# Patient Record
Sex: Male | Born: 1955 | Race: White | Hispanic: No | Marital: Married | Smoking: Never smoker
Health system: Southern US, Community
[De-identification: ages and names within clinical notes are randomized; demographics above are authoritative.]

## PROBLEM LIST (undated history)

## (undated) DIAGNOSIS — N400 Enlarged prostate without lower urinary tract symptoms: Secondary | ICD-10-CM

## (undated) DIAGNOSIS — I472 Ventricular tachycardia: Secondary | ICD-10-CM

## (undated) DIAGNOSIS — M1990 Unspecified osteoarthritis, unspecified site: Secondary | ICD-10-CM

## (undated) DIAGNOSIS — I10 Essential (primary) hypertension: Secondary | ICD-10-CM

## (undated) DIAGNOSIS — N200 Calculus of kidney: Secondary | ICD-10-CM

## (undated) DIAGNOSIS — F419 Anxiety disorder, unspecified: Secondary | ICD-10-CM

## (undated) DIAGNOSIS — I82409 Acute embolism and thrombosis of unspecified deep veins of unspecified lower extremity: Secondary | ICD-10-CM

## (undated) DIAGNOSIS — I4729 Other ventricular tachycardia: Secondary | ICD-10-CM

## (undated) DIAGNOSIS — E663 Overweight: Secondary | ICD-10-CM

## (undated) DIAGNOSIS — I493 Ventricular premature depolarization: Secondary | ICD-10-CM

## (undated) HISTORY — DX: Anxiety disorder, unspecified: F41.9

## (undated) HISTORY — DX: Acute embolism and thrombosis of unspecified deep veins of unspecified lower extremity: I82.409

## (undated) HISTORY — DX: Benign prostatic hyperplasia without lower urinary tract symptoms: N40.0

## (undated) HISTORY — DX: Essential (primary) hypertension: I10

## (undated) HISTORY — DX: Unspecified osteoarthritis, unspecified site: M19.90

## (undated) HISTORY — DX: Ventricular tachycardia: I47.2

## (undated) HISTORY — PX: HERNIA REPAIR: SHX51

## (undated) HISTORY — DX: Other ventricular tachycardia: I47.29

## (undated) HISTORY — DX: Calculus of kidney: N20.0

## (undated) HISTORY — DX: Overweight: E66.3

---

## 2000-06-12 ENCOUNTER — Ambulatory Visit (HOSPITAL_BASED_OUTPATIENT_CLINIC_OR_DEPARTMENT_OTHER): Admission: RE | Admit: 2000-06-12 | Discharge: 2000-06-12 | Payer: Self-pay | Admitting: *Deleted

## 2000-07-10 ENCOUNTER — Ambulatory Visit (HOSPITAL_BASED_OUTPATIENT_CLINIC_OR_DEPARTMENT_OTHER): Admission: RE | Admit: 2000-07-10 | Discharge: 2000-07-10 | Payer: Self-pay | Admitting: *Deleted

## 2006-08-31 ENCOUNTER — Emergency Department (HOSPITAL_COMMUNITY): Admission: EM | Admit: 2006-08-31 | Discharge: 2006-08-31 | Payer: Self-pay | Admitting: Emergency Medicine

## 2010-06-23 NOTE — Op Note (Signed)
[place]. [facility]  Patient:    Bobby Abbott, Bobby Abbott                       MRN: [id number] Proc. Date: 07/10/00 Adm. Date:  [id number] Attending:  Kendell Bane                           Operative Report  PREOPERATIVE DIAGNOSIS:  Possible abscess, left thumb CMC joint.  POSTOPERATIVE DIAGNOSIS:  Negative for abscess.  PROCEDURE:  Exploration of left thumb CMC joint with incision and drainage.  SURGEON:  Lowell Bouton, M.D.  ANESTHESIA:  General anesthesia.  FINDINGS:  The patient had no evidence of purulence in the [facility] joint or along the ulnar side of the first metacarpal.  DESCRIPTION OF PROCEDURE:  Under general anesthesia with a tourniquet on the left arm, the left hand was prepped and draped in the usual fashion.  After elevating the limb, the tourniquet was inflated to 250 mmHg.  A transverse incision was made over the dorsum of the carpal metacarpal joint of the thumb on the left.  Blunt dissection was carried through the subcutaneous tissues and bleeding points were coagulated.  Blunt dissection was carried down to the [facility] joint area and there was no purulent material obtained.  The dissection was carried bluntly along the ulnar side of the first metacarpal where the patient complained of maximum tenderness.  No purulent material was identified.  Care was taken to protect the dorsal branch of the radial artery. The wound was irrigated copiously with saline.  Vessel loop drain was left in for drainage and the skin was closed with a 4-0 subcuticular Prolene.  Sterile dressings were applied followed by a thumb spica splint.  The patient tolerated the procedure well and went to the recovery room awake and stable and in good condition. DD:  07/10/00 TD:  07/10/00 Job: [id number]

## 2010-06-23 NOTE — Op Note (Signed)
[place]. [facility]  Patient:    MAE, CIANCI                       MRN: [id number] Adm. Date:  [id number] Attending:  MeyerdierksEmelda Fear CC:         Dr. Fuller Canada                           Operative Report  PREOPERATIVE DIAGNOSIS:  Subluxation, right first carpometacarpal joint.  POSTOPERATIVE DIAGNOSIS:  Subluxation, right first carpometacarpal joint, with partially-healed malunion of a Bennetts fracture.  PROCEDURE:  Open reduction and internal fixation, right first metacarpal, with pinning of carpometacarpal joint.  SURGEON:  Lowell Bouton, M.D.  ANESTHESIA:  General.  OPERATIVE FINDINGS:  The patient had an eight-week-old Bennetts fracture that had a very small fracture fragment.  The fracture was not completely healed, and there was a step-off at the [facility] joint with subluxation.  The articular surface had some minimal arthritic changes.  DESCRIPTION OF PROCEDURE:  Under general anesthesia with a tourniquet on the right arm, the right hand was prepped and draped in the usual fashion, and after exsanguinating the limb, the tourniquet was inflated to 225 mmHg.  A curved incision was made over the volar aspect of the [facility] joint and carried down through the subcutaneous tissues.  Bleeding points were coagulated.  The thenar muscles were elevated sharply off of the first metacarpal, and the CMC joint was incised.  Subperiosteal dissection was done volarly and dorsally, lifting the muscle off of the base of the first metacarpal.  The joint was then examined and was found to be subluxed radially.  A Freer elevator was used to examine the joint, and there were some minimal arthritic changes.  The step-off noted ulnarly was then identified as a small Bennetts fracture, and a Therapist, nutritional was used to take down the fracture.  The fracture was then reduced and pinned with a 4-5 K-wire x 2.  The [facility] joint alignment improved with  the pinning of the fracture; however, it was totally reduced and stabilized with a 4-5 K-wire.  X-rays showed good alignment of the joint.  The pins were bent over and left protruding through the skin.  The capsular tissue was closed with a 4-0 Vicryl, and the muscle was closed with 4-0 Vicryl.  A vessel loop drain was left in for drainage.  The wound was irrigated and closed with a 4-0 subcuticular Prolene.  Steri-Strips were applied, followed by sterile dressings and a thumb spica splint.  The patient tolerated the procedure well and went to the recovery room awake and stable, in good condition. DD:  06/12/00 TD:  06/13/00 Job: [id number]

## 2013-01-21 ENCOUNTER — Other Ambulatory Visit: Payer: Self-pay | Admitting: Orthopaedic Surgery

## 2013-01-21 DIAGNOSIS — M25512 Pain in left shoulder: Secondary | ICD-10-CM

## 2013-01-24 ENCOUNTER — Other Ambulatory Visit: Payer: Self-pay

## 2013-01-25 ENCOUNTER — Ambulatory Visit
Admission: RE | Admit: 2013-01-25 | Discharge: 2013-01-25 | Disposition: A | Discharge status: Home / Self Care | Payer: BC Managed Care – PPO | Source: Ambulatory Visit | Attending: Orthopaedic Surgery | Admitting: Orthopaedic Surgery

## 2013-01-25 DIAGNOSIS — M25512 Pain in left shoulder: Secondary | ICD-10-CM

## 2014-08-23 ENCOUNTER — Encounter (HOSPITAL_COMMUNITY)
Admission: RE | Disposition: A | Discharge status: Home / Self Care | Payer: Self-pay | Source: Ambulatory Visit | Attending: Ophthalmology

## 2014-08-23 ENCOUNTER — Ambulatory Visit (HOSPITAL_COMMUNITY)
Admission: RE | Admit: 2014-08-23 | Discharge: 2014-08-23 | Disposition: A | Discharge status: Home / Self Care | Payer: 59 | Source: Ambulatory Visit | Attending: Ophthalmology | Admitting: Ophthalmology

## 2014-08-23 DIAGNOSIS — H524 Presbyopia: Secondary | ICD-10-CM | POA: Insufficient documentation

## 2014-08-23 DIAGNOSIS — H26492 Other secondary cataract, left eye: Secondary | ICD-10-CM | POA: Insufficient documentation

## 2014-08-23 DIAGNOSIS — M179 Osteoarthritis of knee, unspecified: Secondary | ICD-10-CM | POA: Diagnosis not present

## 2014-08-23 DIAGNOSIS — Z961 Presence of intraocular lens: Secondary | ICD-10-CM | POA: Insufficient documentation

## 2014-08-23 DIAGNOSIS — H5210 Myopia, unspecified eye: Secondary | ICD-10-CM | POA: Insufficient documentation

## 2014-08-23 DIAGNOSIS — H52209 Unspecified astigmatism, unspecified eye: Secondary | ICD-10-CM | POA: Insufficient documentation

## 2014-08-23 DIAGNOSIS — H2640 Unspecified secondary cataract: Secondary | ICD-10-CM | POA: Diagnosis present

## 2014-08-23 HISTORY — PX: YAG LASER APPLICATION: SHX6189

## 2014-08-23 SURGERY — TREATMENT, USING YAG LASER
Anesthesia: LOCAL | Laterality: Left

## 2014-08-23 MED ORDER — TETRACAINE HCL 0.5 % OP SOLN
OPHTHALMIC | Status: AC
  Filled 2014-08-23: qty 2

## 2014-08-23 MED ORDER — TROPICAMIDE 1 % OP SOLN
OPHTHALMIC | Status: AC
  Filled 2014-08-23: qty 3

## 2014-08-23 MED ORDER — TROPICAMIDE 1 % OP SOLN
1.0000 [drp] | OPHTHALMIC | Status: AC
  Administered 2014-08-23 (×3): 1 [drp] via OPHTHALMIC

## 2014-08-23 MED ORDER — TETRACAINE HCL 0.5 % OP SOLN
1.0000 [drp] | OPHTHALMIC | Status: AC
  Administered 2014-08-23 (×3): 1 [drp] via OPHTHALMIC

## 2014-08-23 NOTE — Brief Op Note (Signed)
Bobby FavaJerry Iman Sr. 08/23/2014  [name], MD  Pre-op Diagnosis:  secondary cataract left eye  Post-op Diagnosis:  same  Yag laser self-test completed: Yes.    Indications:  See scanned office H&P for specific indications  Procedure: YAG posterior capsulotomy OS  Eye protection worn by staff:  Yes.   Laser In Use sign on door:  Yes.    Laser:  {LUMENIS YAG/SLT LASER  selecta duet  Power Setting:  1.7 mJ/burst Anatomical site treated:  Posterior capsule OS Number of applications:  18 Total energy delivered: 28.29 mJ Results:  Open visual axis  Patient was instructed to go to the office, as previously scheduled, for intraocular pressure:  No.  Patient verbalizes understanding of discharge instructions:  Yes.    Notes:  Pt tolerated procedure well.

## 2014-08-23 NOTE — Discharge Instructions (Signed)
Bobby Abbott.  08/23/2014     Instructions    Activity: No Restrictions.   Diet: Resume Diet you were on at home.   Pain Medication: Tylenol if Needed.   CONTACT YOUR DOCTOR IF YOU HAVE PAIN, REDNESS IN YOUR EYE, OR DECREASED VISION.   Follow-up:in 2 weeks with Susa Simmondsarroll F Haines, MD.   Dr. Nile RiggsShapiro: [phone]  Dr. Lita MainsHaines: [phone]: [phone]  Dr. Alto DenverHunt: [phone]: [phone]   If you find that you cannot contact your physician, but feel that your signs and   Symptoms warrant a physician's attention, call the Emergency Room at   [phone] ext.[phone].   Othern/a. FOLLOW UP APPOINTMENT ON 09/14/2014 @ 11:00 AM

## 2014-08-23 NOTE — H&P (Signed)
I have reviewed the pre printed H&P, the patient was re-examined, and I have identified no significant interval changes in the patient's medical condition.  There is no change in the plan of care since the history and physical of record. 

## 2014-08-24 ENCOUNTER — Encounter (HOSPITAL_COMMUNITY): Payer: Self-pay | Admitting: Ophthalmology

## 2015-04-13 ENCOUNTER — Encounter: Payer: Self-pay | Admitting: Emergency Medicine

## 2015-04-13 ENCOUNTER — Emergency Department
Admission: EM | Admit: 2015-04-13 | Discharge: 2015-04-13 | Disposition: A | Discharge status: Home / Self Care | Payer: BLUE CROSS/BLUE SHIELD | Attending: Student | Admitting: Student

## 2015-04-13 ENCOUNTER — Emergency Department: Payer: BLUE CROSS/BLUE SHIELD

## 2015-04-13 DIAGNOSIS — S40011A Contusion of right shoulder, initial encounter: Secondary | ICD-10-CM | POA: Insufficient documentation

## 2015-04-13 DIAGNOSIS — Y9389 Activity, other specified: Secondary | ICD-10-CM | POA: Diagnosis not present

## 2015-04-13 DIAGNOSIS — Y92410 Unspecified street and highway as the place of occurrence of the external cause: Secondary | ICD-10-CM | POA: Insufficient documentation

## 2015-04-13 DIAGNOSIS — S161XXA Strain of muscle, fascia and tendon at neck level, initial encounter: Secondary | ICD-10-CM | POA: Insufficient documentation

## 2015-04-13 DIAGNOSIS — S7001XA Contusion of right hip, initial encounter: Secondary | ICD-10-CM | POA: Insufficient documentation

## 2015-04-13 DIAGNOSIS — Z880 Allergy status to penicillin: Secondary | ICD-10-CM | POA: Insufficient documentation

## 2015-04-13 DIAGNOSIS — I10 Essential (primary) hypertension: Secondary | ICD-10-CM | POA: Diagnosis not present

## 2015-04-13 DIAGNOSIS — Y998 Other external cause status: Secondary | ICD-10-CM | POA: Diagnosis not present

## 2015-04-13 DIAGNOSIS — S199XXA Unspecified injury of neck, initial encounter: Secondary | ICD-10-CM | POA: Diagnosis present

## 2015-04-13 MED ORDER — TRAMADOL HCL 50 MG PO TABS: 50.0000 mg | ORAL_TABLET | Freq: Four times a day (QID) | ORAL | Status: DC | PRN

## 2015-04-13 MED ORDER — IBUPROFEN 800 MG PO TABS
800.0000 mg | ORAL_TABLET | Freq: Once | ORAL | Status: AC
  Administered 2015-04-13: 800 mg via ORAL
  Filled 2015-04-13: qty 1

## 2015-04-13 MED ORDER — METHOCARBAMOL 500 MG PO TABS
1000.0000 mg | ORAL_TABLET | Freq: Once | ORAL | Status: AC
  Administered 2015-04-13: 1000 mg via ORAL
  Filled 2015-04-13: qty 2

## 2015-04-13 MED ORDER — METHOCARBAMOL 750 MG PO TABS: 1500.0000 mg | ORAL_TABLET | Freq: Four times a day (QID) | ORAL | Status: DC

## 2015-04-13 MED ORDER — TRAMADOL HCL 50 MG PO TABS
50.0000 mg | ORAL_TABLET | Freq: Once | ORAL | Status: AC
  Administered 2015-04-13: 50 mg via ORAL
  Filled 2015-04-13: qty 1

## 2015-04-13 MED ORDER — IBUPROFEN 800 MG PO TABS: 800.0000 mg | ORAL_TABLET | Freq: Three times a day (TID) | ORAL | Status: DC | PRN
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
## 2015-04-13 NOTE — ED Notes (Signed)
Correction: pain in right hip and shoulder, bruising noted to right hip.

## 2015-04-16 ENCOUNTER — Emergency Department (HOSPITAL_COMMUNITY)
Admission: EM | Admit: 2015-04-16 | Discharge: 2015-04-16 | Disposition: A | Discharge status: Home / Self Care | Payer: BLUE CROSS/BLUE SHIELD | Attending: Emergency Medicine | Admitting: Emergency Medicine

## 2015-04-16 ENCOUNTER — Encounter (HOSPITAL_COMMUNITY): Payer: Self-pay | Admitting: Emergency Medicine

## 2015-04-16 ENCOUNTER — Emergency Department (HOSPITAL_COMMUNITY): Payer: BLUE CROSS/BLUE SHIELD

## 2015-04-16 DIAGNOSIS — Y929 Unspecified place or not applicable: Secondary | ICD-10-CM | POA: Diagnosis not present

## 2015-04-16 DIAGNOSIS — Y999 Unspecified external cause status: Secondary | ICD-10-CM | POA: Diagnosis not present

## 2015-04-16 DIAGNOSIS — I10 Essential (primary) hypertension: Secondary | ICD-10-CM | POA: Insufficient documentation

## 2015-04-16 DIAGNOSIS — Z791 Long term (current) use of non-steroidal anti-inflammatories (NSAID): Secondary | ICD-10-CM | POA: Insufficient documentation

## 2015-04-16 DIAGNOSIS — S39012A Strain of muscle, fascia and tendon of lower back, initial encounter: Secondary | ICD-10-CM | POA: Insufficient documentation

## 2015-04-16 DIAGNOSIS — Y939 Activity, unspecified: Secondary | ICD-10-CM | POA: Diagnosis not present

## 2015-04-16 DIAGNOSIS — S344XXA Injury of lumbosacral plexus, initial encounter: Secondary | ICD-10-CM | POA: Diagnosis present

## 2015-04-16 HISTORY — DX: Ventricular premature depolarization: I49.3

## 2015-04-16 NOTE — Discharge Instructions (Signed)
The [facility] will help with the muscle spasm. You may apply ice or heat to the area as well. Follow up with your doctor. Return here as needed.
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
## 2015-07-25 ENCOUNTER — Encounter: Payer: Self-pay | Admitting: *Deleted

## 2015-07-26 ENCOUNTER — Encounter: Payer: Self-pay | Admitting: Cardiology

## 2015-07-26 ENCOUNTER — Encounter: Payer: Self-pay | Admitting: *Deleted

## 2015-07-26 ENCOUNTER — Ambulatory Visit (INDEPENDENT_AMBULATORY_CARE_PROVIDER_SITE_OTHER): Payer: BLUE CROSS/BLUE SHIELD | Admitting: Cardiology

## 2015-07-26 VITALS — BP 148/85 | HR 79 | Ht 71.0 in | Wt 279.6 lb

## 2015-07-26 DIAGNOSIS — I10 Essential (primary) hypertension: Secondary | ICD-10-CM

## 2015-07-26 DIAGNOSIS — I471 Supraventricular tachycardia: Secondary | ICD-10-CM

## 2015-07-26 DIAGNOSIS — R002 Palpitations: Secondary | ICD-10-CM | POA: Diagnosis not present

## 2015-07-26 DIAGNOSIS — F419 Anxiety disorder, unspecified: Secondary | ICD-10-CM

## 2015-07-26 NOTE — Patient Instructions (Signed)
Medication Instructions:  Your physician recommends that you continue on your current medications as directed. Please refer to the Current Medication list given to you today.  Labwork: NONE  Testing/Procedures: Your physician has recommended that you wear an event monitor for 30 days. Event monitors are medical devices that record the heart's electrical activity. Doctors most often us these monitors to diagnose arrhythmias. Arrhythmias are problems with the speed or rhythm of the heartbeat. The monitor is a small, portable device. You can wear one while you do your normal daily activities. This is usually used to diagnose what is causing palpitations/syncope (passing out). [employer] will contact you  About this monitor.  Follow-Up: Your physician recommends that you schedule a follow-up appointment in: pending test results. We will call you with the results of your monitor.  Any Other Special Instructions Will Be Listed Below (If Applicable).  If you need a refill on your cardiac medications before your next appointment, please call your pharmacy.
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
## 2015-08-01 ENCOUNTER — Ambulatory Visit (INDEPENDENT_AMBULATORY_CARE_PROVIDER_SITE_OTHER): Payer: BLUE CROSS/BLUE SHIELD

## 2015-08-01 DIAGNOSIS — R002 Palpitations: Secondary | ICD-10-CM | POA: Diagnosis not present

## 2015-08-19 ENCOUNTER — Emergency Department (HOSPITAL_COMMUNITY): Payer: BLUE CROSS/BLUE SHIELD

## 2015-08-19 ENCOUNTER — Encounter (HOSPITAL_COMMUNITY): Payer: Self-pay | Admitting: *Deleted

## 2015-08-19 ENCOUNTER — Telehealth: Payer: Self-pay | Admitting: Cardiology

## 2015-08-19 DIAGNOSIS — E669 Obesity, unspecified: Secondary | ICD-10-CM | POA: Insufficient documentation

## 2015-08-19 DIAGNOSIS — I472 Ventricular tachycardia: Secondary | ICD-10-CM | POA: Diagnosis not present

## 2015-08-19 DIAGNOSIS — R000 Tachycardia, unspecified: Secondary | ICD-10-CM | POA: Diagnosis present

## 2015-08-19 DIAGNOSIS — I10 Essential (primary) hypertension: Secondary | ICD-10-CM | POA: Insufficient documentation

## 2015-08-19 DIAGNOSIS — Z6841 Body Mass Index (BMI) 40.0 and over, adult: Secondary | ICD-10-CM | POA: Diagnosis not present

## 2015-08-19 LAB — CBC
HEMATOCRIT: 43.4 % (ref 39.0–52.0)
HEMOGLOBIN: 15 g/dL (ref 13.0–17.0)
MCH: 33.1 pg (ref 26.0–34.0)
MCHC: 34.6 g/dL (ref 30.0–36.0)
MCV: 95.8 fL (ref 78.0–100.0)
Platelets: 200 10*3/uL (ref 150–400)
RBC: 4.53 MIL/uL (ref 4.22–5.81)
RDW: 12.6 % (ref 11.5–15.5)
WBC: 9.6 10*3/uL (ref 4.0–10.5)

## 2015-08-19 LAB — BASIC METABOLIC PANEL
ANION GAP: 6 (ref 5–15)
BUN: 14 mg/dL (ref 6–20)
CHLORIDE: 108 mmol/L (ref 101–111)
CO2: 25 mmol/L (ref 22–32)
Calcium: 9.4 mg/dL (ref 8.9–10.3)
Creatinine, Ser: 1.3 mg/dL — ABNORMAL HIGH (ref 0.61–1.24)
GFR calc Af Amer: >60 mL/min (ref >60)
GFR, EST NON AFRICAN AMERICAN: 59 mL/min — AB (ref >60)
GLUCOSE: 83 mg/dL (ref 65–99)
POTASSIUM: 4.1 mmol/L (ref 3.5–5.1)
Sodium: 139 mmol/L (ref 135–145)

## 2015-08-19 LAB — TROPONIN I: Troponin I: <0.03 ng/mL (ref <0.03)

## 2015-08-19 NOTE — Telephone Encounter (Signed)
Pt is wearing event monitor and tonight he had 11 sec of V tach.  HR 210.  Pt was aware of rapid HR.  He did not feel lightheaded but in the past with these arrhthymias he has almost passed out.  [facility].  He knows not to drive.  He has had these episodes for 30 years.  Dr. Diona BrownerMcDowell has reviewed.  Plan will be to admit and plan EP consult in AM.

## 2015-08-19 NOTE — ED Notes (Signed)
The pt is c/o a rapid heart rate intermittently since he was 42.  He is currently on a heart monitor.  He was called and told to come in at 1530 today because his heart was beating at 200.  Not now

## 2015-08-20 ENCOUNTER — Emergency Department (HOSPITAL_COMMUNITY)
Admission: EM | Admit: 2015-08-20 | Discharge: 2015-08-20 | Discharge status: Against Medical Advice | Payer: BLUE CROSS/BLUE SHIELD | Attending: Emergency Medicine | Admitting: Emergency Medicine

## 2015-08-20 DIAGNOSIS — I472 Ventricular tachycardia, unspecified: Secondary | ICD-10-CM

## 2015-08-20 LAB — MAGNESIUM: MAGNESIUM: 2.2 mg/dL (ref 1.7–2.4)
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
## 2015-08-20 NOTE — Progress Notes (Signed)
Mr. Bobby Abbott is patient of Diona BrownerMcDowell who has been getting workup for palpitations as out patient. He suffers from palpitations for the last 18-20 years. He was given an event monitor outpt and today the event monitor showed wide complex tachycardia self terminated. He states that he feels these symptoms for the last 20 years and they self terminate. He was advised to come to ED tonight and [facility]. Currently, he denies any symptoms. He would prefer to get all the workup done as outpt and does not want to stay in hospital. I have discussed all the risks  In detail. He understands and wants to see Dr. Diona BrownerMcDowell in office and get outpt wokrup  He will be discharged with outpt follow up . Sulaiman Alonna MiniumAziz Rathore

## 2015-08-20 NOTE — Discharge Instructions (Signed)
An arrhythmia is a change in the heart's normal rate or rhythm. Typically, the heart beats with a regular rhythm at a rate between 60 and 100 beats per minute. Problems with the heart's electrical system or the heart's response to the electrical signal can interrupt the heart's coordination and cause arrhythmias. Ventricular arrhythmias are abnormalities that affect these ventricles, or lower chambers of the heart.  Ventricular arrhythmias include:  An electrocardiogram of an episode of sustained ventricular tachycardia. An electrocardiogram of an episode of sustained ventricular tachycardia. Premature ventricular complexes (PVCs), which are premature heartbeats; Ventricular tachycardia, an abnormally fast heartbeat; and Ventricular fibrillation, in which the heart quivers rather than contracts. Ventricular fibrillation is a medical emergency that requires immediate attention.  WHAT ARE THE SYMPTOMS?  An electrocardiogram reflecting the irregular, pulseless electrical activity of ventricular fibrillation. An electrocardiogram reflecting the irregular, pulseless electrical activity of ventricular fibrillation. Some ventricular arrhythmias have no symptoms. When symptoms do occur, they include:  Diminished or irregular pulse; Fatigue; Shortness of breath; Fainting (syncope); Palpitations (awareness of one's own heartbeat); Low blood pressure; Chest pain; and Cardiac arrest. CAUSES AND RISK FACTORS  The causes of ventricular arrhythmias include: Coronary heart disease (CHD); Congestive heart failure; Left ventricular dysfunction; Hypertension (high blood pressure); Cardiomyopathy (dilated or hypertrophic); Congenital (inherited) heart disease; and Valve disease (aortic or mitral). DIAGNOSIS  The physician will take a detailed medical history and perform a physical examination, during which he or she may hear irregular heartbeats with a stethoscope. The physician may also order  tests, including an electrocardiogram (ECG), either resting or ambulatory (using a portable machine called a Holter monitor or a loop recorder). The physician may also order electrophysiology testing.  TREATMENT APPROACH  Many cases of arrhythmias may not require treatment. Other arrhythmias can be treated by treating any underlying heart disease. Treatments for ventricular arrhythmia includes: Defibrillation; Medication (beta-blockers and antiarrhythmic agents); Radiofrequency catheter ablation; Angioplasty; and Pacemaker implantation.

## 2015-08-23 ENCOUNTER — Telehealth: Payer: Self-pay | Admitting: *Deleted

## 2015-08-23 DIAGNOSIS — I472 Ventricular tachycardia, unspecified: Secondary | ICD-10-CM

## 2015-08-23 NOTE — Telephone Encounter (Signed)
-----   Message from Jonelle SidleSamuel G McDowell, MD sent at 08/23/2015  7:47 AM EDT ----- This patient is currently wearing an event monitor that showed VT. [facility], seen by the cardiology fellow, and sent home for outpatient cardiac workup. Please make sure that he has a visit scheduled. I will be referring him to EP as well so you may want to go ahead and get him on their schedule for new consult.  ----- Message -----    From: Leone BrandLaura R Ingold, NP    Sent: 08/22/2015  10:43 AM      To: Jonelle SidleSamuel G McDowell, MD  Hi pt did come to ER and talked with fellow but wanted to be worked up as outpt.   Your office may need to call.  Thanks.

## 2015-08-24 NOTE — Telephone Encounter (Signed)
Patient informed and verbalized understanding of plan. 

## 2015-08-26 ENCOUNTER — Telehealth: Payer: Self-pay | Admitting: Cardiology

## 2015-09-06 ENCOUNTER — Encounter: Payer: Self-pay | Admitting: Internal Medicine

## 2015-09-07 ENCOUNTER — Ambulatory Visit (INDEPENDENT_AMBULATORY_CARE_PROVIDER_SITE_OTHER): Payer: BLUE CROSS/BLUE SHIELD | Admitting: Internal Medicine

## 2015-09-07 ENCOUNTER — Encounter (INDEPENDENT_AMBULATORY_CARE_PROVIDER_SITE_OTHER): Payer: Self-pay

## 2015-09-07 ENCOUNTER — Encounter: Payer: Self-pay | Admitting: Internal Medicine

## 2015-09-07 VITALS — BP 149/85 | HR 72 | Ht 72.0 in | Wt 275.8 lb

## 2015-09-07 DIAGNOSIS — R002 Palpitations: Secondary | ICD-10-CM | POA: Diagnosis not present

## 2015-09-07 DIAGNOSIS — I472 Ventricular tachycardia, unspecified: Secondary | ICD-10-CM

## 2015-09-07 NOTE — Patient Instructions (Signed)
Medication Instructions:   Your physician recommends that you continue on your current medications as directed. Please refer to the Current Medication list given to you today.   Labwork: None ordered   Testing/Procedures: Your physician has requested that you have a cardiac MRI. Cardiac MRI uses a computer to create images of your heart as its beating, producing both still and moving pictures of your heart and major blood vessels. For further information please visit [email or web address]. Please follow the instruction sheet given to you today for more information.    Follow-Up: Your physician recommends that you schedule a follow-up appointment in: 4 weeks in [facility] by Fri with your records to give to Dr Allred   Any Other Special Instructions Will Be Listed Below (If Applicable).     If you need a refill on your cardiac medications before your next appointment, please call your pharmacy.
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
## 2015-09-09 ENCOUNTER — Encounter: Payer: Self-pay | Admitting: Internal Medicine

## 2015-09-13 ENCOUNTER — Encounter: Payer: Self-pay | Admitting: Internal Medicine

## 2015-09-19 ENCOUNTER — Ambulatory Visit (HOSPITAL_COMMUNITY)
Admission: RE | Admit: 2015-09-19 | Discharge: 2015-09-19 | Disposition: A | Discharge status: Home / Self Care | Payer: BLUE CROSS/BLUE SHIELD | Source: Ambulatory Visit | Attending: Internal Medicine | Admitting: Internal Medicine

## 2015-09-19 ENCOUNTER — Encounter (HOSPITAL_COMMUNITY): Payer: Self-pay

## 2015-09-19 DIAGNOSIS — I472 Ventricular tachycardia, unspecified: Secondary | ICD-10-CM

## 2015-09-23 ENCOUNTER — Ambulatory Visit: Payer: BLUE CROSS/BLUE SHIELD | Admitting: Cardiology

## 2015-10-05 ENCOUNTER — Encounter: Payer: Self-pay | Admitting: Internal Medicine

## 2015-10-05 ENCOUNTER — Ambulatory Visit (INDEPENDENT_AMBULATORY_CARE_PROVIDER_SITE_OTHER): Payer: BLUE CROSS/BLUE SHIELD | Admitting: Internal Medicine

## 2015-10-05 VITALS — BP 148/98 | HR 86 | Ht 72.0 in | Wt 277.0 lb

## 2015-10-05 DIAGNOSIS — I472 Ventricular tachycardia, unspecified: Secondary | ICD-10-CM

## 2015-10-05 DIAGNOSIS — R002 Palpitations: Secondary | ICD-10-CM | POA: Diagnosis not present

## 2015-10-05 NOTE — Patient Instructions (Signed)
Medication Instructions:  Continue all current medications.  Labwork: none  Testing/Procedures: none  Follow-Up: 3 months   Any Other Special Instructions Will Be Listed Below (If Applicable).  If you need a refill on your cardiac medications before your next appointment, please call your pharmacy.  
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
## 2015-11-01 ENCOUNTER — Ambulatory Visit: Payer: BLUE CROSS/BLUE SHIELD | Admitting: Cardiology

## 2015-12-14 ENCOUNTER — Ambulatory Visit (INDEPENDENT_AMBULATORY_CARE_PROVIDER_SITE_OTHER): Payer: BLUE CROSS/BLUE SHIELD | Admitting: Internal Medicine

## 2015-12-14 ENCOUNTER — Encounter: Payer: Self-pay | Admitting: Internal Medicine

## 2015-12-14 VITALS — BP 136/90 | HR 69 | Ht 72.0 in | Wt 284.8 lb

## 2015-12-14 DIAGNOSIS — R002 Palpitations: Secondary | ICD-10-CM | POA: Diagnosis not present

## 2015-12-14 DIAGNOSIS — I472 Ventricular tachycardia, unspecified: Secondary | ICD-10-CM
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
## 2015-12-14 NOTE — Patient Instructions (Signed)
Medication Instructions:  Continue all current medications.  Labwork: none  Testing/Procedures: none  Follow-Up: 4 months   Any Other Special Instructions Will Be Listed Below (If Applicable).  If you need a refill on your cardiac medications before your next appointment, please call your pharmacy.\ 

## 2015-12-16 ENCOUNTER — Encounter: Payer: BLUE CROSS/BLUE SHIELD | Admitting: Internal Medicine

## 2016-01-06 ENCOUNTER — Encounter: Payer: BLUE CROSS/BLUE SHIELD | Admitting: Internal Medicine

## 2016-04-13 ENCOUNTER — Ambulatory Visit: Payer: BLUE CROSS/BLUE SHIELD | Admitting: Internal Medicine

## 2016-04-18 ENCOUNTER — Telehealth (INDEPENDENT_AMBULATORY_CARE_PROVIDER_SITE_OTHER): Payer: Self-pay | Admitting: Orthopaedic Surgery

## 2016-04-18 NOTE — Telephone Encounter (Signed)
[facility] FA[facility] DDS [phone]

## 2017-06-23 IMAGING — DX DG CHEST 2V
2 series · 2 of 2 positions shown · non-contrast
Comparison: None.

CLINICAL DATA: Rapid heart rate.

EXAM:
CHEST  2 VIEW

[chest pa]
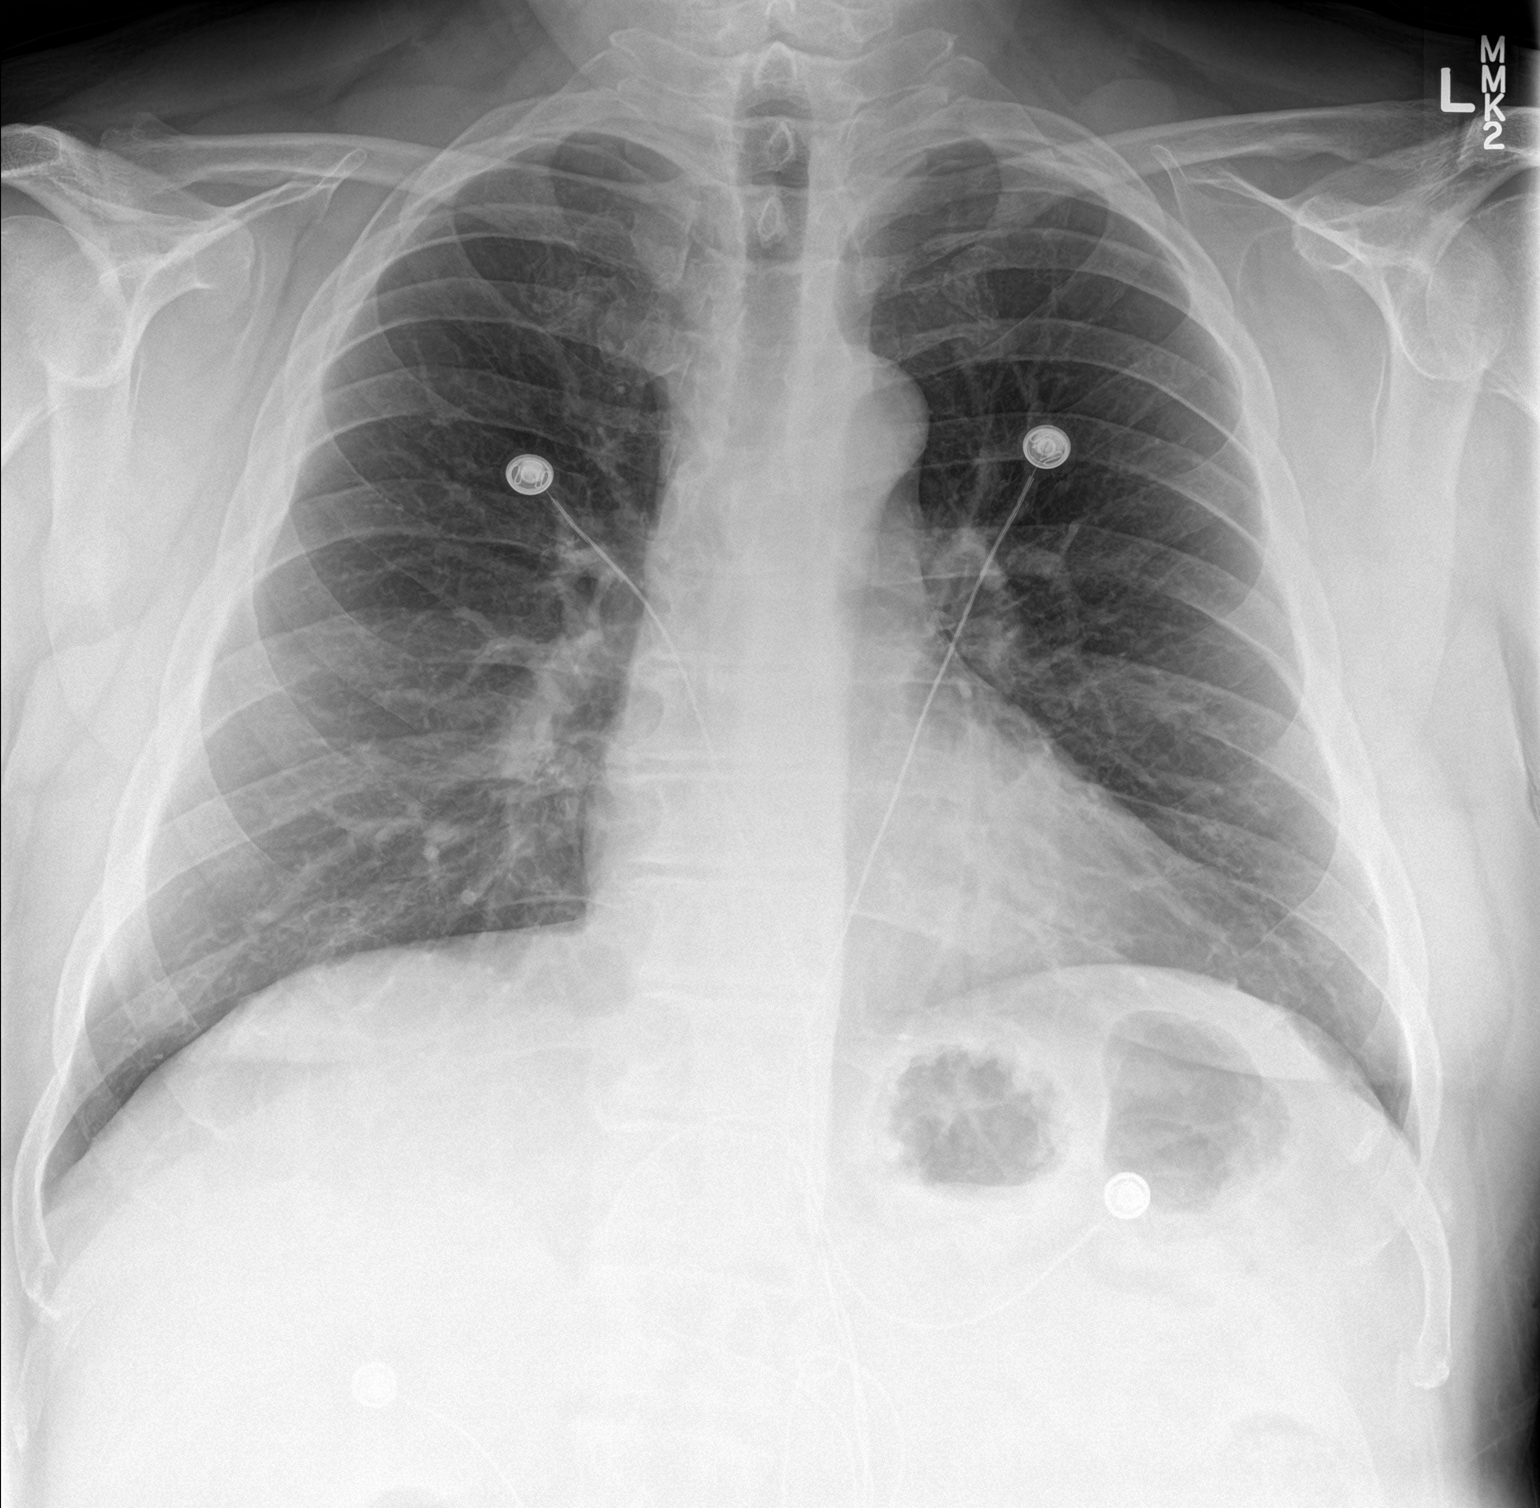

[chest lat]
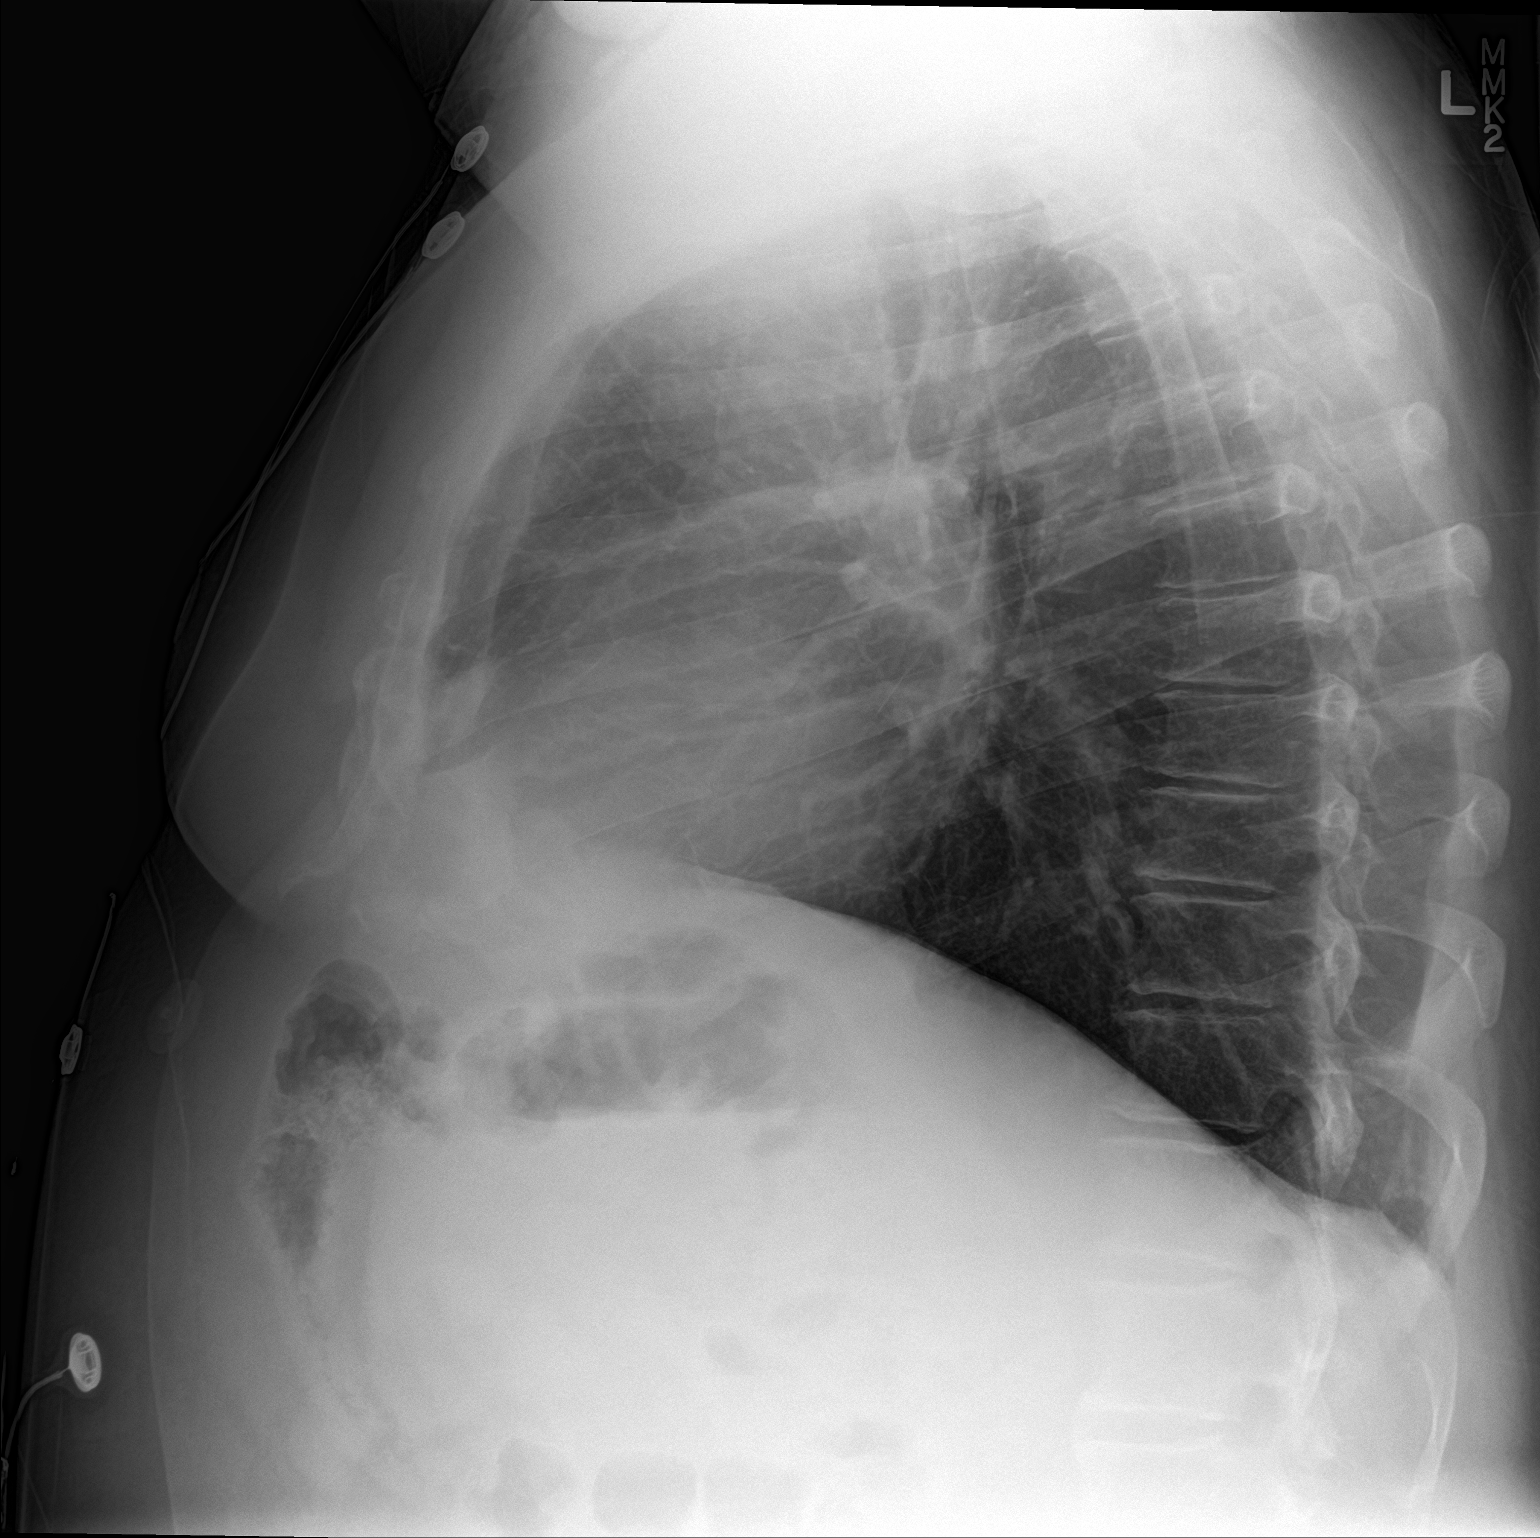

[2 of 2 positions shown; findings below may reference images not displayed]

FINDINGS: The heart size and mediastinal contours are within normal limits.
Both lungs are clear. The visualized skeletal structures are
unremarkable.
IMPRESSION: No active cardiopulmonary disease.

## 2021-12-07 ENCOUNTER — Ambulatory Visit: Payer: BLUE CROSS/BLUE SHIELD | Admitting: Internal Medicine

## 2022-01-16 ENCOUNTER — Other Ambulatory Visit: Payer: Self-pay | Admitting: Internal Medicine

## 2022-01-16 ENCOUNTER — Ambulatory Visit: Payer: Medicare Other | Attending: Internal Medicine

## 2022-01-16 ENCOUNTER — Telehealth: Payer: Self-pay | Admitting: Internal Medicine

## 2022-01-16 ENCOUNTER — Encounter: Payer: Self-pay | Admitting: Internal Medicine

## 2022-01-16 ENCOUNTER — Ambulatory Visit: Payer: Medicare Other | Attending: Internal Medicine | Admitting: Internal Medicine

## 2022-01-16 VITALS — BP 140/80 | HR 66 | Ht 72.0 in | Wt 267.0 lb

## 2022-01-16 DIAGNOSIS — R002 Palpitations: Secondary | ICD-10-CM

## 2022-01-16 DIAGNOSIS — I493 Ventricular premature depolarization: Secondary | ICD-10-CM

## 2022-01-16 DIAGNOSIS — I10 Essential (primary) hypertension: Secondary | ICD-10-CM

## 2022-01-16 NOTE — Patient Instructions (Addendum)
Medication Instructions:  ?Your physician recommends that you continue on your current medications as directed. Please refer to the Current Medication list given to you today. ? ?Labwork: ?none ? ?Testing/Procedures: ?Your physician has recommended that you wear a Zio monitor.  ? ?This monitor is a medical device that records the heart?s electrical activity. Doctors most often use these monitors to diagnose arrhythmias. Arrhythmias are problems with the speed or rhythm of the heartbeat. The monitor is a small device applied to your chest. You can wear one while you do your normal daily activities. While wearing this monitor if you have any symptoms to push the button and record what you felt. Once you have worn this monitor for the period of time provider prescribed (for 7 days), you will return the monitor device in the postage paid box. Once it is returned they will download the data collected and provide us with a report which the provider will then review and we will call you with those results. Important tips: ? ?Avoid showering during the first 24 hours of wearing the monitor. ?Avoid excessive sweating to help maximize wear time. ?Do not submerge the device, no hot tubs, and no swimming pools. ?Keep any lotions or oils away from the patch. ?After 24 hours you may shower with the patch on. Take brief showers with your back facing the shower head.  ?Do not remove patch once it has been placed because that will interrupt data and decrease adhesive wear time. ?Push the button when you have any symptoms and write down what you were feeling. ?Once you have completed wearing your monitor, remove and place into box which has postage paid and place in your outgoing mailbox.  ?If for some reason you have misplaced your box then call our office and we can provide another box and/or mail it off for you. ? ?Follow-Up: ?Your physician recommends that you schedule a follow-up appointment in: 6 months ? ?Any Other Special  Instructions Will Be Listed Below (If Applicable). ? ?If you need a refill on your cardiac medications before your next appointment, please call your pharmacy. ?

## 2022-01-16 NOTE — Telephone Encounter (Signed)
PERCERT:  7 Day ZIO XT dx: PVC's
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
## 2022-02-12 ENCOUNTER — Telehealth: Payer: Self-pay | Admitting: *Deleted

## 2022-02-12 NOTE — Telephone Encounter (Signed)
Zio suite is showing pending return. Patient contacted and says he mailed the monitor back before Christmas. Email sent to [facility] requesting a new monitor be mailed to patient.

## 2022-07-27 ENCOUNTER — Ambulatory Visit: Payer: Medicare Other | Admitting: Internal Medicine

## 2022-08-16 ENCOUNTER — Ambulatory Visit (INDEPENDENT_AMBULATORY_CARE_PROVIDER_SITE_OTHER): Payer: Medicare Other | Admitting: Orthopaedic Surgery

## 2022-08-16 ENCOUNTER — Other Ambulatory Visit: Payer: Self-pay

## 2022-08-16 VITALS — BP 129/72 | HR 81 | Ht 69.0 in | Wt 267.0 lb

## 2022-08-16 DIAGNOSIS — M7662 Achilles tendinitis, left leg: Secondary | ICD-10-CM

## 2022-08-16 DIAGNOSIS — M25572 Pain in left ankle and joints of left foot: Secondary | ICD-10-CM | POA: Diagnosis not present

## 2022-08-16 DIAGNOSIS — M7660 Achilles tendinitis, unspecified leg: Secondary | ICD-10-CM | POA: Insufficient documentation
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
## 2022-10-18 ENCOUNTER — Ambulatory Visit (INDEPENDENT_AMBULATORY_CARE_PROVIDER_SITE_OTHER): Payer: Medicare Other | Admitting: Orthopaedic Surgery

## 2022-10-18 DIAGNOSIS — M7662 Achilles tendinitis, left leg: Secondary | ICD-10-CM
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
## 2022-11-11 ENCOUNTER — Encounter (HOSPITAL_COMMUNITY): Payer: Self-pay

## 2022-11-11 ENCOUNTER — Other Ambulatory Visit: Payer: Self-pay

## 2022-11-11 ENCOUNTER — Emergency Department (HOSPITAL_COMMUNITY)
Admission: EM | Admit: 2022-11-11 | Discharge: 2022-11-11 | Disposition: A | Discharge status: Home / Self Care | Payer: Medicare Other

## 2022-11-11 DIAGNOSIS — H5711 Ocular pain, right eye: Secondary | ICD-10-CM | POA: Diagnosis present

## 2022-11-11 DIAGNOSIS — L03213 Periorbital cellulitis: Secondary | ICD-10-CM | POA: Diagnosis not present

## 2022-11-11 DIAGNOSIS — H00011 Hordeolum externum right upper eyelid: Secondary | ICD-10-CM | POA: Diagnosis not present

## 2022-11-11 MED ORDER — CLINDAMYCIN HCL 300 MG PO CAPS: 300.0000 mg | ORAL_CAPSULE | Freq: Three times a day (TID) | ORAL | 0 refills | Status: AC

## 2022-11-11 NOTE — ED Provider Notes (Signed)
[place] EMERGENCY DEPARTMENT AT [facility] Provider Note   CSN: [id number] Arrival date & time: 11/11/22  4098     History  Chief Complaint  Patient presents with   Eye Pain    Bobby Seidel Sr. is a 67 y.o. male.  67 year old male present emergency department for right eyelid pain reports being prescribed some oral medication which Bobby Abbott thinks is an antibiotic did not improve went to urgent care couple days ago got steroid/antibiotic eyedrops and erythromycin ointment.  Reports symptoms have persisted, told Bobby Abbott has a stye.  Has been doing heat compresses.  Denies vision changes, painful EOM's.  Headache, no fevers.   Eye Pain       Home Medications Prior to Admission medications   Medication Sig Start Date End Date Taking? Authorizing Provider  clindamycin (CLEOCIN) 300 MG capsule Take 1 capsule (300 mg total) by mouth 3 (three) times daily for 7 days. 11/11/22 11/18/22 Yes YoungHarmon Dun, DO      Allergies    Penicillins, Sulfa antibiotics, and Beta adrenergic blockers    Review of Systems   Review of Systems  Eyes:  Positive for pain.    Physical Exam Updated Vital Signs BP (!) 156/95   Pulse 61   Temp 98.5 F (36.9 C)   Resp 20   Wt 113.4 kg   SpO2 100%   BMI 36.92 kg/m  Physical Exam Vitals and nursing note reviewed.  HENT:     Head: Normocephalic.     Mouth/Throat:     Mouth: Mucous membranes are moist.  Eyes:     General: No scleral icterus.       Right eye: No discharge.        Left eye: No discharge.     Extraocular Movements: Extraocular movements intact.     Conjunctiva/sclera: Conjunctivae normal.     Pupils: Pupils are equal, round, and reactive to light.     Comments: Patient with some redness and swelling to his upper eyelid, appears to have stye on eversion of the eyelid.  Cardiovascular:     Rate and Rhythm: Normal rate.  Pulmonary:     Effort: Pulmonary effort is normal.  Neurological:     General: No focal deficit  present.     Mental Status: Bobby Abbott is alert.  Psychiatric:        Mood and Affect: Mood normal.        Behavior: Behavior normal.     ED Results / Procedures / Treatments   Labs (all labs ordered are listed, but only abnormal results are displayed) Labs Reviewed - No data to display  EKG None  Radiology No results found.  Procedures Procedures    Medications Ordered in ED Medications - No data to display  ED Course/ Medical Decision Making/ A&P                                 Medical Decision Making Is a 67 year old male presents emergency department with eyelid redness and swelling.  Afebrile vital signs reassuring.  Has what appears to be some cellulitis to the upper eyelid in the setting of stye/challazon.  Does not have any findings concerning for preseptal cellulitis/orbital cellulitis.   counseled to continue warm compresses.  Will give oral antibiotics and referral to ophthalmology.          Final Clinical Impression(s) / ED Diagnoses Final diagnoses:  Preseptal cellulitis of  right eye  Hordeolum externum of right upper eyelid    Rx / DC Orders ED Discharge Orders          Ordered    clindamycin (CLEOCIN) 300 MG capsule  3 times daily        11/11/22 1126              Carrine Kroboth, Harmon Dun, DO 11/11/22 1128

## 2022-11-11 NOTE — Discharge Instructions (Signed)
We are prescribing you antibiotics.  Please take them for the full course.  Please continue warm compresses as discussed.  Please call and follow-up with ophthalmology.  Return Bobby Abbott develop fevers, chills, sudden onset headache, vision changes, severe pain moving your eye or you develop any new or worsening symptoms that are concerning to you.

## 2022-11-11 NOTE — ED Triage Notes (Signed)
C/o right eye pain, redness, and swelling x9 days.  Seen at urgent care on Friday and provided ointment and eye drops w/o relief for stye.  Pt attempted to call Eye doctor and get appt and was unable to get in.  Denies vision problems, double vision, or blurry vision

## 2022-11-11 NOTE — ED Notes (Signed)
Pt has swelling to right eyelid and it is painful to the touch. The eye has some redness as well. Pt has seen an a physician at an urgent care and was diagnosed with a sty. Patient finished oral antibiotics yesterday.

## 2023-09-27 ENCOUNTER — Other Ambulatory Visit: Payer: Self-pay

## 2023-09-27 DIAGNOSIS — I872 Venous insufficiency (chronic) (peripheral): Secondary | ICD-10-CM

## 2023-09-30 ENCOUNTER — Other Ambulatory Visit: Payer: Self-pay | Admitting: Vascular Surgery

## 2023-09-30 DIAGNOSIS — I872 Venous insufficiency (chronic) (peripheral): Secondary | ICD-10-CM

## 2023-09-30 DIAGNOSIS — I824Y2 Acute embolism and thrombosis of unspecified deep veins of left proximal lower extremity: Secondary | ICD-10-CM

## 2023-10-01 ENCOUNTER — Encounter: Payer: Self-pay | Admitting: Vascular Surgery

## 2023-10-01 ENCOUNTER — Telehealth: Payer: Self-pay

## 2023-10-01 ENCOUNTER — Ambulatory Visit
Admission: RE | Admit: 2023-10-01 | Discharge: 2023-10-01 | Disposition: A | Discharge status: Home / Self Care | Source: Ambulatory Visit | Attending: Vascular Surgery | Admitting: Vascular Surgery

## 2023-10-01 ENCOUNTER — Ambulatory Visit: Admitting: Vascular Surgery

## 2023-10-01 ENCOUNTER — Other Ambulatory Visit (HOSPITAL_COMMUNITY): Payer: Self-pay

## 2023-10-01 VITALS — BP 146/94 | HR 61 | Temp 98.0°F | Ht 69.0 in | Wt 256.0 lb

## 2023-10-01 DIAGNOSIS — I872 Venous insufficiency (chronic) (peripheral): Secondary | ICD-10-CM | POA: Insufficient documentation

## 2023-10-01 DIAGNOSIS — I824Y2 Acute embolism and thrombosis of unspecified deep veins of left proximal lower extremity: Secondary | ICD-10-CM

## 2023-10-01 NOTE — Telephone Encounter (Signed)
 Patient Advocate Encounter  Test billing for this patients current coverage (Rx [employer] Medicare) returns $35 for 30 days, $135 for 90 days of Eliquis.  This patient is not eligible to use copay savings card due to current coverage.  This test claim was processed through [facility]- copay amounts may vary at other pharmacies due to pharmacy/plan contracts, or as the patient moves through the different stages of their insurance plan.  Rachel DEL, CPhT Rx Patient Advocate Phone: [phone]
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
